# Patient Record
Sex: Female | Born: 1935 | Race: White | Hispanic: No | Marital: Married | Smoking: Never smoker
Health system: Southern US, Community
[De-identification: ages and names within clinical notes are randomized; demographics above are authoritative.]

## PROBLEM LIST (undated history)

## (undated) HISTORY — PX: ABDOMINAL HYSTERECTOMY: SHX81

## (undated) HISTORY — PX: BURR HOLE FOR SUBDURAL HEMATOMA: SHX1275

---

## 2016-10-13 ENCOUNTER — Emergency Department (INDEPENDENT_AMBULATORY_CARE_PROVIDER_SITE_OTHER): Payer: Medicare Other

## 2016-10-13 ENCOUNTER — Encounter: Payer: Self-pay | Admitting: Emergency Medicine

## 2016-10-13 ENCOUNTER — Emergency Department (INDEPENDENT_AMBULATORY_CARE_PROVIDER_SITE_OTHER)
Admission: EM | Admit: 2016-10-13 | Discharge: 2016-10-13 | Disposition: A | Discharge status: Home / Self Care | Payer: Medicare Other | Source: Home / Self Care | Attending: Family Medicine | Admitting: Family Medicine

## 2016-10-13 DIAGNOSIS — I517 Cardiomegaly: Secondary | ICD-10-CM | POA: Diagnosis not present

## 2016-10-13 DIAGNOSIS — I700 Atherosclerosis of aorta: Secondary | ICD-10-CM

## 2016-10-13 DIAGNOSIS — J209 Acute bronchitis, unspecified: Secondary | ICD-10-CM

## 2016-10-13 DIAGNOSIS — R05 Cough: Secondary | ICD-10-CM

## 2016-10-13 DIAGNOSIS — R059 Cough, unspecified: Secondary | ICD-10-CM

## 2016-10-13 MED ORDER — AEROCHAMBER PLUS W/MASK MISC: 2 refills | Status: AC

## 2016-10-13 MED ORDER — PREDNISONE 20 MG PO TABS: ORAL_TABLET | ORAL | 0 refills | Status: AC

## 2016-10-13 MED ORDER — AMOXICILLIN 500 MG PO CAPS: 500.0000 mg | ORAL_CAPSULE | Freq: Three times a day (TID) | ORAL | 0 refills | Status: AC

## 2016-10-13 MED ORDER — ALBUTEROL SULFATE HFA 108 (90 BASE) MCG/ACT IN AERS: 1.0000 | INHALATION_SPRAY | Freq: Four times a day (QID) | RESPIRATORY_TRACT | 0 refills | Status: AC | PRN

## 2016-10-13 MED ORDER — AMOXICILLIN 500 MG PO CAPS: 500.0000 mg | ORAL_CAPSULE | Freq: Three times a day (TID) | ORAL | 0 refills | Status: DC

## 2016-10-13 NOTE — ED Provider Notes (Signed)
CSN: [id number]     Arrival date & time 10/13/16  1732 History   First MD Initiated Contact with Patient 10/13/16 1738     Chief Complaint  Patient presents with  . Cough   (Consider location/radiation/quality/duration/timing/severity/associated sxs/prior Treatment) HPI Sallee Hogrefe is a 83 y.o. female presenting to UC with daughters with c/o non-productive cough for about 3-4 days, rhinorrhea, bilateral ear full fullness and generalized weakness. No known fever. No n/v/d. No chest pain.  She has taken cetirizine and delsym with mild relief.  One daughter is visiting from out of town and notes pt has a remote hx of mild CHF. She wants to make sure pt is not developing that. Pt denies recent increase in leg swelling. She does have a hx of HTN. She has been taking her BP medication as prescribed. No hx of asthma or COPD.  Remote hx of pneumonia.   History reviewed. No pertinent past medical history. Past Surgical History:  Procedure Laterality Date  . ABDOMINAL HYSTERECTOMY    . BURR HOLE FOR SUBDURAL HEMATOMA     No family history on file. Social History  Substance Use Topics  . Smoking status: Never Smoker  . Smokeless tobacco: Never Used  . Alcohol use No   OB History    No data available     Review of Systems  Constitutional: Negative for chills and fever.  HENT: Positive for congestion, ear pain and rhinorrhea. Negative for sore throat, trouble swallowing and voice change.   Respiratory: Positive for cough. Negative for shortness of breath.   Cardiovascular: Negative for chest pain and palpitations.  Gastrointestinal: Negative for abdominal pain, diarrhea, nausea and vomiting.  Musculoskeletal: Negative for arthralgias, back pain and myalgias.  Skin: Negative for rash.  Neurological: Negative for dizziness, light-headedness and headaches.    Allergies  Norco [hydrocodone-acetaminophen] and Percocet [oxycodone-acetaminophen]  Home Medications   Prior to Admission  medications   Medication Sig Start Date End Date Taking? Authorizing Provider  ammonium lactate (LAC-HYDRIN) 12 % lotion Apply 1 application topically as needed for dry skin.   Yes [provider]  aspirin 81 MG tablet Take 81 mg by mouth daily.   Yes [provider]  Cholecalciferol (VITAMIN D PO) Take by mouth.   Yes [provider]  Clotrimazole-Betamethasone (LOTRISONE EX) Apply topically.   Yes [provider]  collagenase (SANTYL) ointment Apply 1 application topically daily.   Yes [provider]  DIGOXIN PO Take by mouth.   Yes [provider]  GABAPENTIN PO Take by mouth.   Yes [provider]  losartan (COZAAR) 25 MG tablet Take 25 mg by mouth daily.   Yes [provider]  metFORMIN (GLUCOPHAGE) 500 MG tablet Take by mouth 2 (two) times daily with a meal.   Yes [provider]  metoprolol tartrate (LOPRESSOR) 100 MG tablet Take 100 mg by mouth 2 (two) times daily.   Yes [provider]  Multiple Vitamins-Minerals (CENTRUM SILVER PO) Take by mouth.   Yes [provider]  simvastatin (ZOCOR) 40 MG tablet Take 40 mg by mouth daily.   Yes [provider]  triamcinolone ointment (KENALOG) 0.1 % Apply 1 application topically 2 (two) times daily.   Yes [provider]  Zinc Oxide (SECURA EX) Apply topically.   Yes [provider]  albuterol (PROVENTIL HFA;VENTOLIN HFA) 108 (90 Base) MCG/ACT inhaler Inhale 1-2 puffs into the lungs every 6 (six) hours as needed for wheezing or shortness of breath. 10/13/16  Waylan RocherPhelps, Johnie Stadel O, PA-C  amoxicillin (AMOXIL) 500 MG capsule Take 1 capsule (500 mg total) by mouth 3 (three) times daily. 10/13/16   Lurene ShadowPhelps, Deniece Rankin O, PA-C  predniSONE (DELTASONE) 20 MG tablet 3 tabs po day one, then 2 po daily x 4 days 10/13/16   Lurene ShadowPhelps, Sami Roes O, PA-C  Spacer/Aero-Holding Chambers (AEROCHAMBER PLUS WITH MASK) inhaler Use as instructed 10/13/16   Lurene ShadowPhelps, Nataley Bahri O,  PA-C   Meds Ordered and Administered this Visit  Medications - No data to display  BP (!) 145/70   Pulse 93   Temp 98.1 F (36.7 C) (Oral)   SpO2 97%  No data found.   Physical Exam  Constitutional: She is oriented to person, place, and time. She appears well-developed and well-nourished. No distress.  HENT:  Head: Normocephalic and atraumatic.  Right Ear: Tympanic membrane normal.  Left Ear: Tympanic membrane normal.  Nose: Nose normal.  Mouth/Throat: Uvula is midline, oropharynx is clear and moist and mucous membranes are normal.  Eyes: EOM are normal.  Neck: Normal range of motion. Neck supple.  Cardiovascular: Normal rate and regular rhythm.   Pulmonary/Chest: Effort normal. No stridor. No respiratory distress. She has no wheezes. She has rhonchi (faint, diffuse). She has no rales.  Musculoskeletal: Normal range of motion.  Lymphadenopathy:    She has no cervical adenopathy.  Neurological: She is alert and oriented to person, place, and time.  Skin: Skin is warm and dry. She is not diaphoretic.  Psychiatric: She has a normal mood and affect. Her behavior is normal.  Nursing note and vitals reviewed.   Urgent Care Course     Procedures (including critical care time)  Labs Review Labs Reviewed - No data to display  Imaging Review Dg Chest 2 View  Result Date: 10/13/2016 CLINICAL DATA:  Nonproductive cough.  Congestion.  Weakness. EXAM: CHEST  2 VIEW COMPARISON:  None. FINDINGS: Borderline mild cardiomegaly. Tortuous atherosclerotic thoracic aorta. Small retrocardiac hiatal hernia suspected. Mild hyperinflation. Pulmonary vasculature is normal. No consolidation, pleural effusion, or pneumothorax. No acute osseous abnormalities are seen. Exaggerated thoracic a physis. The bones appear under mineralized. IMPRESSION: 1. Mild cardiomegaly with tortuous atherosclerotic thoracic aorta. No congestive failure. 2. Mild hyperinflation, of uncertain acuity, can be seen with  bronchitis. Electronically Signed   By: Rubye OaksMelanie  Ehinger M.D.   On: 10/13/2016 18:06      MDM   1. Acute bronchitis, unspecified organism   2. Cough    Hx and exam with CXR c/w bronchitis.  Due to pt's age and hx of pneumonia, will start on antibiotics  Rx: amoxicillin (was going to prescribe azithromycin, however, pt is on Digoxin), prednisone, albuterol inhaler and spacer  Encouraged f/u with PCP in 7-10 days if not improving, sooner if worsening. Pt and daughters agreeable with tx plan.     Lurene Shadowhelps, Christin Mccreedy O, [place]PA-C 10/13/16 [id number]

## 2016-10-13 NOTE — ED Triage Notes (Signed)
Pt c/o non-productive cough, weak feeling, runny nose, bilateral ear pain x 3-4 days.

## 2016-10-13 NOTE — Discharge Instructions (Signed)
°  You have been prescribed 5 days of prednisone, an oral steroid to help with inflammation in your lungs to help improve cough and breathing.  You may start this medication tomorrow with breakfast as it can make it difficult to sleep if taken at night.  But if cough is more bothersome this evening, it is okay to start tonight.

## 2016-10-15 ENCOUNTER — Telehealth: Payer: Self-pay | Admitting: *Deleted

## 2016-10-15 NOTE — Telephone Encounter (Signed)
Spoke to pt she reports that she is feeling much better. Advised her to call back if she has any questions or concerns.

## 2018-02-09 IMAGING — DX DG CHEST 2V
2 series · 2 of 2 positions shown · non-contrast
Comparison: None.

CLINICAL DATA: Nonproductive cough.  Congestion.  Weakness.

EXAM:
CHEST  2 VIEW

[chest pa]
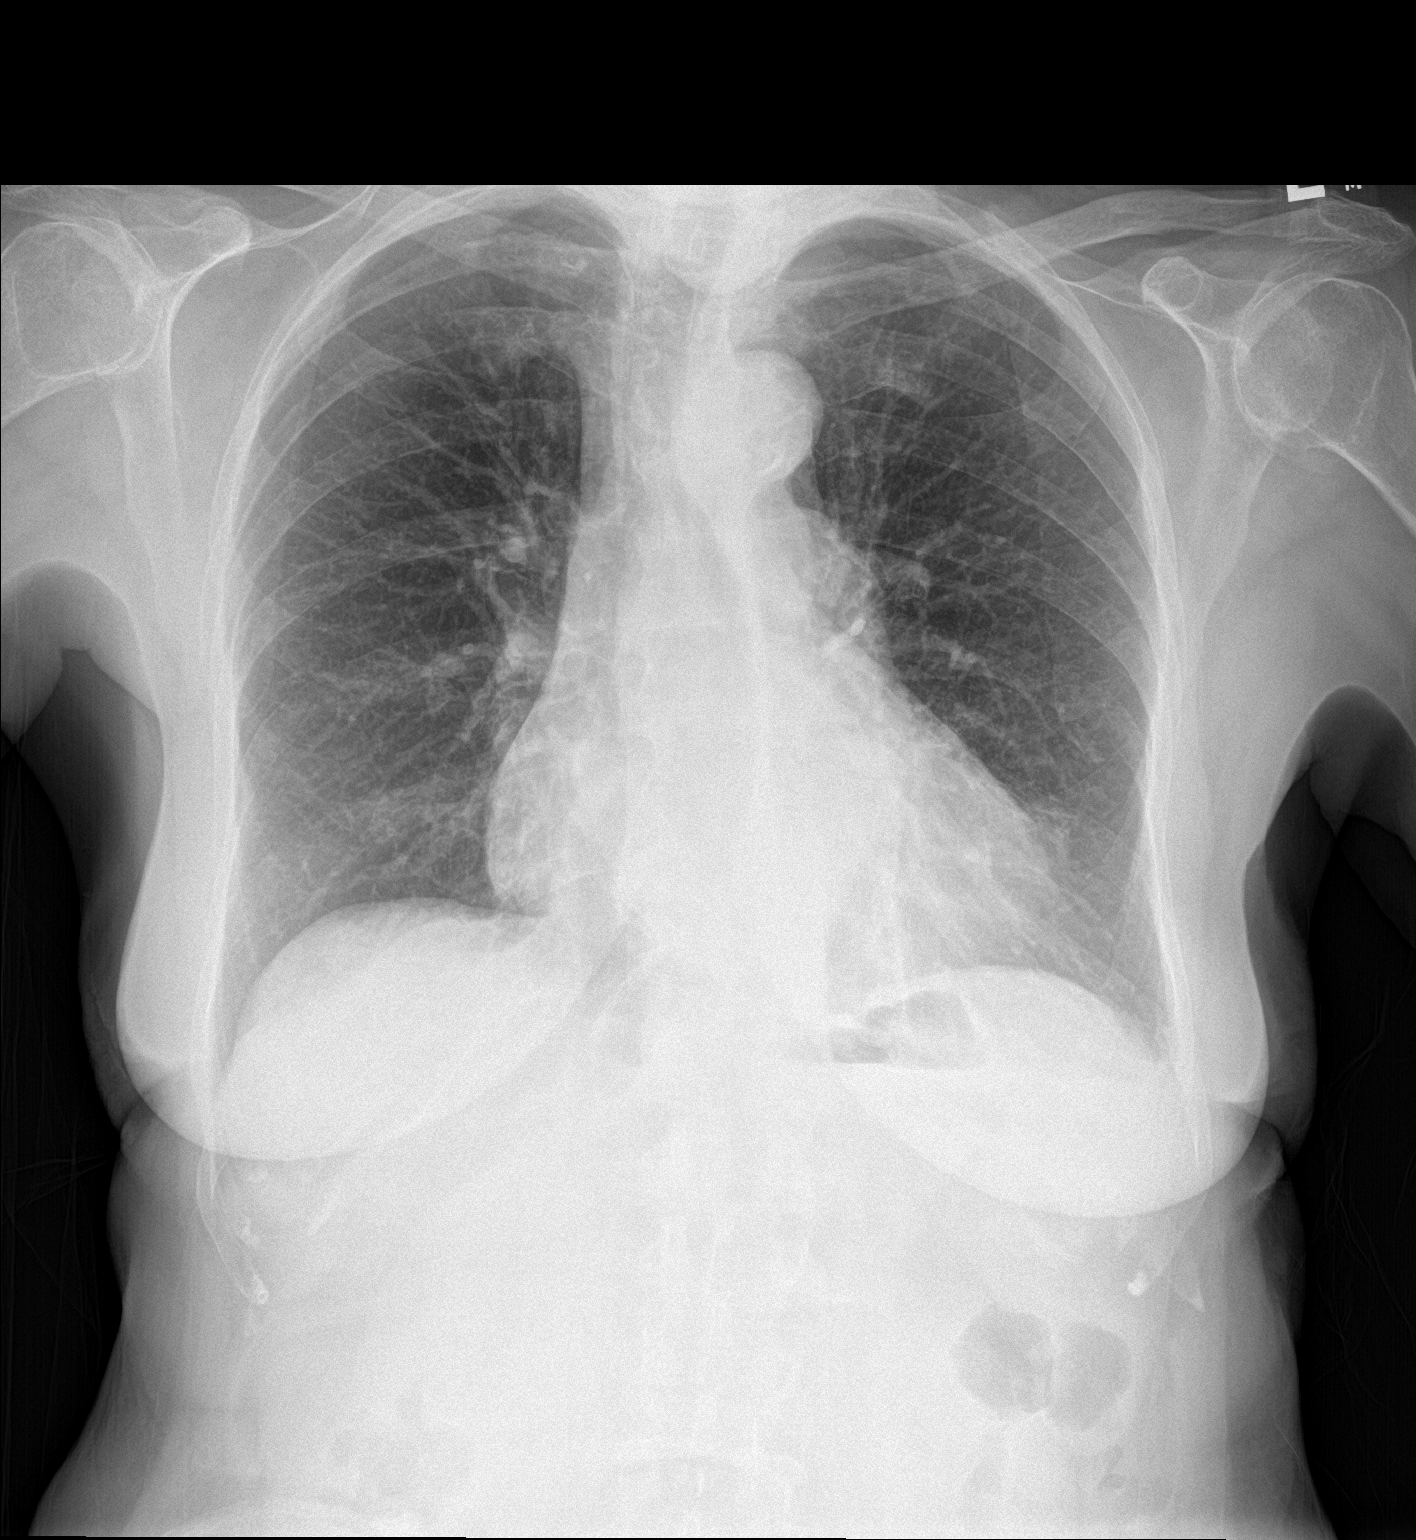

[chest lat]
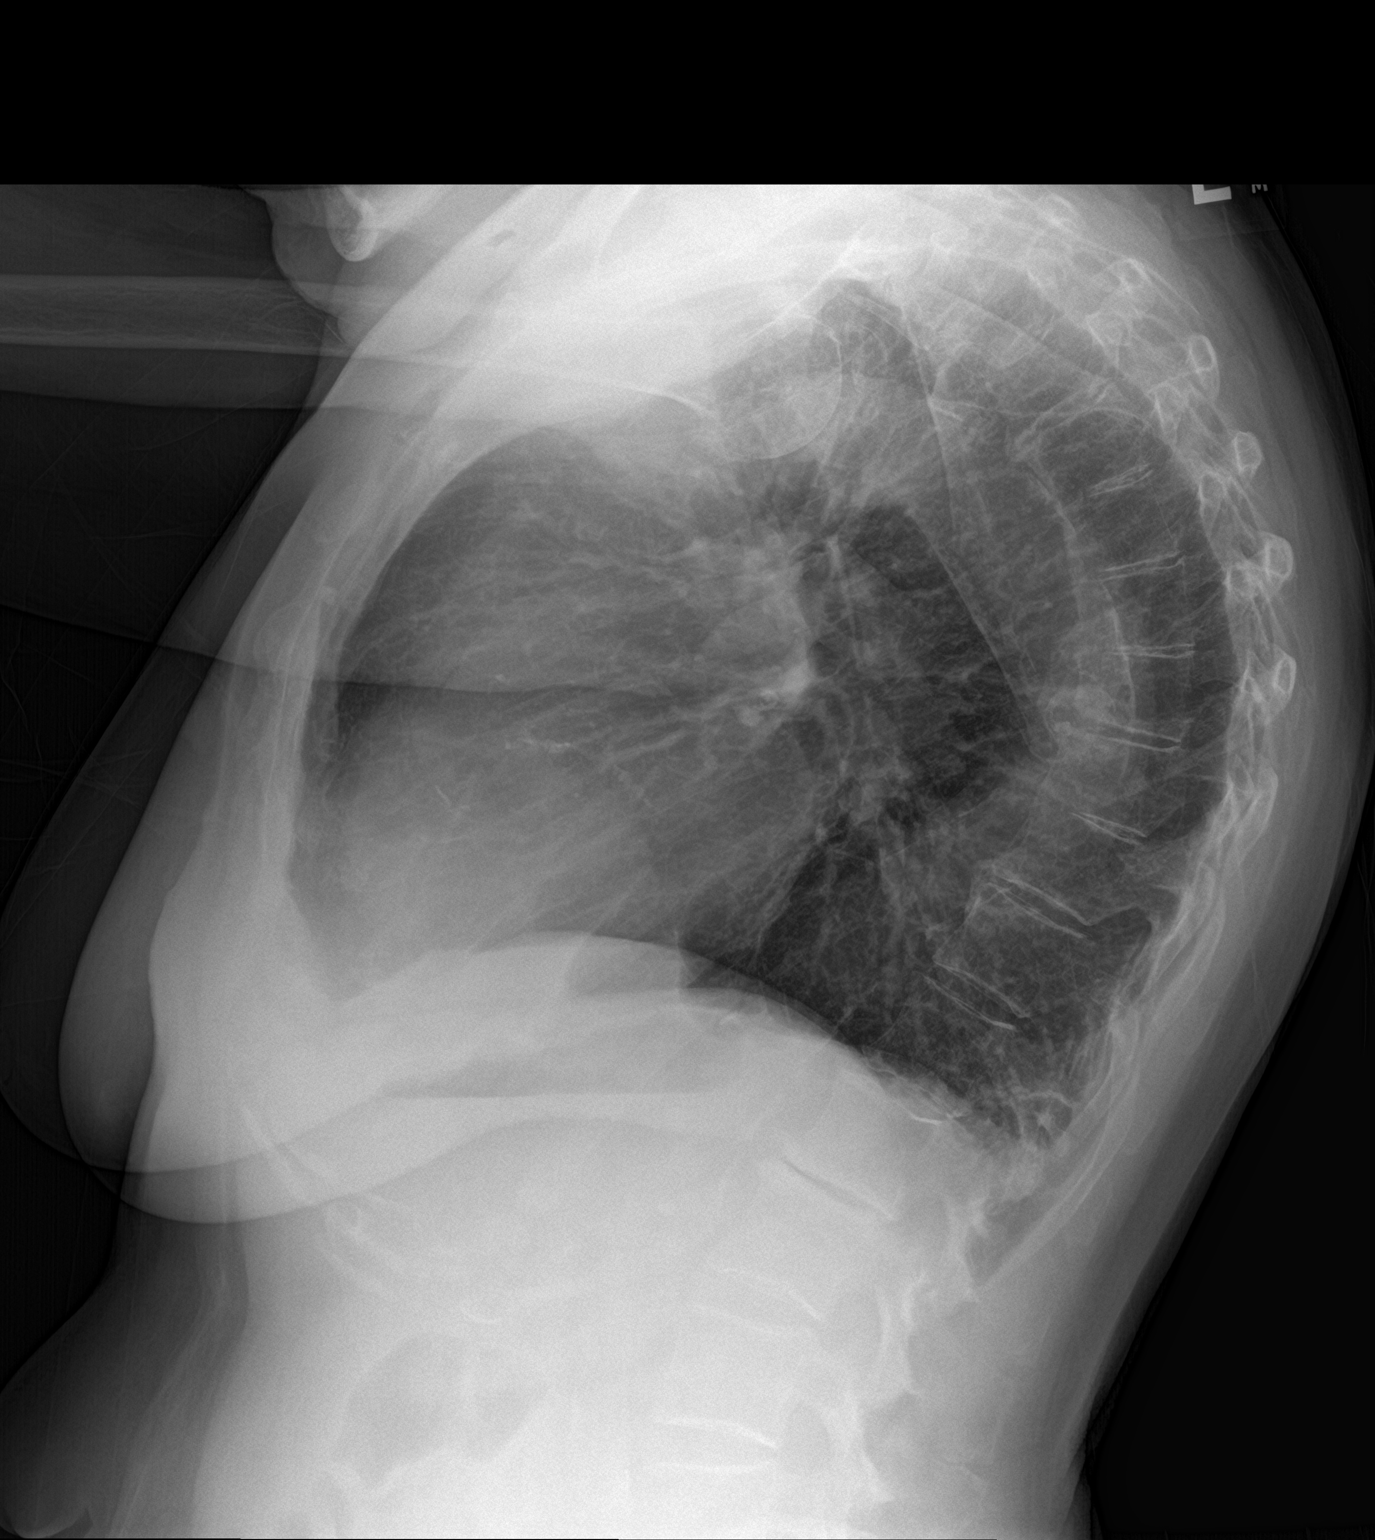

[2 of 2 positions shown; findings below may reference images not displayed]

FINDINGS: Borderline mild cardiomegaly. Tortuous atherosclerotic thoracic
aorta. Small retrocardiac hiatal hernia suspected. Mild
hyperinflation. Pulmonary vasculature is normal. No consolidation,
pleural effusion, or pneumothorax. No acute osseous abnormalities
are seen. Exaggerated thoracic a physis. The bones appear under
mineralized.
IMPRESSION: 1. Mild cardiomegaly with tortuous atherosclerotic thoracic aorta.
No congestive failure.
2. Mild hyperinflation, of uncertain acuity, can be seen with
bronchitis.
